# Patient Record
Sex: Female | Born: 1990 | Race: White | Hispanic: No | Marital: Single | Smoking: Current every day smoker
Health system: Southern US, Community
[De-identification: ages and names within clinical notes are randomized; demographics above are authoritative.]

## PROBLEM LIST (undated history)

## (undated) DIAGNOSIS — F32A Depression, unspecified: Secondary | ICD-10-CM

## (undated) DIAGNOSIS — A630 Anogenital (venereal) warts: Secondary | ICD-10-CM

## (undated) DIAGNOSIS — F99 Mental disorder, not otherwise specified: Secondary | ICD-10-CM

## (undated) DIAGNOSIS — F329 Major depressive disorder, single episode, unspecified: Secondary | ICD-10-CM

## (undated) DIAGNOSIS — R51 Headache: Secondary | ICD-10-CM

## (undated) HISTORY — DX: Headache: R51

## (undated) HISTORY — DX: Mental disorder, not otherwise specified: F99

---
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
## 2010-04-01 ENCOUNTER — Emergency Department (HOSPITAL_COMMUNITY)
Admission: EM | Admit: 2010-04-01 | Discharge: 2010-04-01 | Disposition: A | Discharge status: Home / Self Care | Payer: Medicaid Other | Attending: Emergency Medicine | Admitting: Emergency Medicine

## 2010-04-01 DIAGNOSIS — O99891 Other specified diseases and conditions complicating pregnancy: Secondary | ICD-10-CM | POA: Insufficient documentation

## 2010-04-01 DIAGNOSIS — H11419 Vascular abnormalities of conjunctiva, unspecified eye: Secondary | ICD-10-CM | POA: Insufficient documentation

## 2010-04-01 DIAGNOSIS — F3130 Bipolar disorder, current episode depressed, mild or moderate severity, unspecified: Secondary | ICD-10-CM | POA: Insufficient documentation

## 2010-04-01 DIAGNOSIS — B354 Tinea corporis: Secondary | ICD-10-CM | POA: Insufficient documentation

## 2010-04-01 DIAGNOSIS — H109 Unspecified conjunctivitis: Secondary | ICD-10-CM | POA: Insufficient documentation

## 2010-04-01 DIAGNOSIS — H5789 Other specified disorders of eye and adnexa: Secondary | ICD-10-CM | POA: Insufficient documentation

## 2010-04-01 LAB — URINALYSIS, ROUTINE W REFLEX MICROSCOPIC
Glucose, UA: NEGATIVE mg/dL
Hgb urine dipstick: NEGATIVE
Ketones, ur: NEGATIVE mg/dL
Protein, ur: NEGATIVE mg/dL

## 2010-04-01 LAB — URINE MICROSCOPIC-ADD ON

## 2010-04-04 LAB — CBC
HCT: 40 % (ref 36.0–46.0)
Hemoglobin: 13.5 g/dL (ref 12.0–15.0)
Hemoglobin: 14.3 g/dL (ref 12.0–15.0)
MCH: 30 pg (ref 26.0–34.0)
MCHC: 33.8 g/dL (ref 30.0–36.0)
MCV: 88.8 fL (ref 78.0–100.0)
Platelets: 253 10*3/uL (ref 150–400)
RBC: 4.5 MIL/uL (ref 3.87–5.11)
RDW: 13.2 % (ref 11.5–15.5)

## 2010-04-04 LAB — BASIC METABOLIC PANEL
BUN: 8 mg/dL (ref 6–23)
Calcium: 9.3 mg/dL (ref 8.4–10.5)
Chloride: 107 mEq/L (ref 96–112)
Creatinine, Ser: 0.75 mg/dL (ref 0.4–1.2)
Creatinine, Ser: 0.78 mg/dL (ref 0.4–1.2)
GFR calc Af Amer: >60 mL/min (ref >60)
GFR calc Af Amer: >60 mL/min (ref >60)
Glucose, Bld: 92 mg/dL (ref 70–99)
Potassium: 3.6 mEq/L (ref 3.5–5.1)
Potassium: 4 mEq/L (ref 3.5–5.1)

## 2010-04-04 LAB — URINALYSIS, ROUTINE W REFLEX MICROSCOPIC
Glucose, UA: NEGATIVE mg/dL
Ketones, ur: NEGATIVE mg/dL

## 2010-04-04 LAB — HEPATIC FUNCTION PANEL
ALT: 16 U/L (ref 0–35)
AST: 18 U/L (ref 0–37)
Albumin: 3.5 g/dL (ref 3.5–5.2)
Alkaline Phosphatase: 89 U/L (ref 39–117)
Bilirubin, Direct: 0.1 mg/dL (ref 0.0–0.3)
Indirect Bilirubin: 0.5 mg/dL (ref 0.3–0.9)
Total Bilirubin: 0.6 mg/dL (ref 0.3–1.2)
Total Protein: 6.7 g/dL (ref 6.0–8.3)

## 2010-04-04 LAB — DIFFERENTIAL
Basophils Relative: 1 % (ref 0–1)
Eosinophils Absolute: 0.1 10*3/uL (ref 0.0–0.7)
Eosinophils Relative: 1 % (ref 0–5)
Lymphs Abs: 1.5 10*3/uL (ref 0.7–4.0)
Monocytes Relative: 6 % (ref 3–12)
Neutro Abs: 6.7 10*3/uL (ref 1.7–7.7)
Neutrophils Relative %: 75 % (ref 43–77)

## 2010-04-04 LAB — RAPID URINE DRUG SCREEN, HOSP PERFORMED
Cocaine: POSITIVE — AB
Cocaine: POSITIVE — AB
Opiates: POSITIVE — AB
Tetrahydrocannabinol: NOT DETECTED

## 2010-04-04 LAB — URINE MICROSCOPIC-ADD ON

## 2010-04-04 LAB — TSH: TSH: 0.64 u[IU]/mL (ref 0.350–4.500)

## 2010-04-25 LAB — URINALYSIS, ROUTINE W REFLEX MICROSCOPIC
Bilirubin Urine: NEGATIVE
Bilirubin Urine: NEGATIVE
Glucose, UA: NEGATIVE mg/dL
Glucose, UA: NEGATIVE mg/dL
Hgb urine dipstick: NEGATIVE
Hgb urine dipstick: NEGATIVE
Ketones, ur: NEGATIVE mg/dL
Ketones, ur: NEGATIVE mg/dL
Nitrite: NEGATIVE
Nitrite: NEGATIVE
Protein, ur: NEGATIVE mg/dL
Protein, ur: NEGATIVE mg/dL
Specific Gravity, Urine: 1.025 (ref 1.005–1.030)
Specific Gravity, Urine: 1.025 (ref 1.005–1.030)
Urobilinogen, UA: 0.2 mg/dL (ref 0.0–1.0)
Urobilinogen, UA: 0.2 mg/dL (ref 0.0–1.0)
pH: 6 (ref 5.0–8.0)
pH: 5.5 (ref 5.0–8.0)

## 2010-04-25 LAB — CBC
HCT: 40 % (ref 36.0–46.0)
Hemoglobin: 13.4 g/dL (ref 12.0–15.0)
Hemoglobin: 13.8 g/dL (ref 12.0–15.0)
MCHC: 33.6 g/dL (ref 30.0–36.0)
MCHC: 33.8 g/dL (ref 30.0–36.0)
MCV: 88.3 fL (ref 78.0–100.0)
MCV: 88.8 fL (ref 78.0–100.0)
RBC: 4.53 MIL/uL (ref 3.87–5.11)
RBC: 4.61 MIL/uL (ref 3.87–5.11)
RDW: 12.5 % (ref 11.5–15.5)

## 2010-04-25 LAB — GC/CHLAMYDIA PROBE AMP, GENITAL
Chlamydia, DNA Probe: POSITIVE — AB
GC Probe Amp, Genital: NEGATIVE

## 2010-04-25 LAB — COMPREHENSIVE METABOLIC PANEL
CO2: 29 mEq/L (ref 19–32)
Calcium: 9.3 mg/dL (ref 8.4–10.5)
Chloride: 103 mEq/L (ref 96–112)
Creatinine, Ser: 0.9 mg/dL (ref 0.4–1.2)
GFR calc non Af Amer: >60 mL/min (ref >60)
Glucose, Bld: 92 mg/dL (ref 70–99)
Total Bilirubin: 0.5 mg/dL (ref 0.3–1.2)

## 2010-04-25 LAB — POCT PREGNANCY, URINE: Preg Test, Ur: NEGATIVE

## 2010-04-25 LAB — WET PREP, GENITAL
Clue Cells Wet Prep HPF POC: NONE SEEN
Trich, Wet Prep: NONE SEEN
Yeast Wet Prep HPF POC: NONE SEEN

## 2010-04-25 LAB — RAPID URINE DRUG SCREEN, HOSP PERFORMED
Amphetamines: NOT DETECTED
Barbiturates: NOT DETECTED
Benzodiazepines: NOT DETECTED

## 2010-04-25 LAB — URINE DRUGS OF ABUSE SCREEN W ALC, ROUTINE (REF LAB)
Methadone: NEGATIVE
Opiate Screen, Urine: NEGATIVE

## 2010-04-25 LAB — RPR: RPR Ser Ql: NONREACTIVE

## 2010-04-27 LAB — URINALYSIS, MICROSCOPIC ONLY
Nitrite: POSITIVE — AB
Protein, ur: NEGATIVE mg/dL
pH: 6 (ref 5.0–8.0)

## 2010-04-27 LAB — HEPATIC FUNCTION PANEL
Albumin: 4 g/dL (ref 3.5–5.2)
Alkaline Phosphatase: 100 U/L (ref 47–119)
Total Bilirubin: 0.5 mg/dL (ref 0.3–1.2)
Total Protein: 7.2 g/dL (ref 6.0–8.3)

## 2010-04-27 LAB — RAPID URINE DRUG SCREEN, HOSP PERFORMED
Amphetamines: NOT DETECTED
Barbiturates: NOT DETECTED
Benzodiazepines: POSITIVE — AB
Tetrahydrocannabinol: NOT DETECTED

## 2010-04-27 LAB — BASIC METABOLIC PANEL
BUN: 6 mg/dL (ref 6–23)
Calcium: 9 mg/dL (ref 8.4–10.5)
Creatinine, Ser: 0.68 mg/dL (ref 0.4–1.2)

## 2010-04-27 LAB — HCG, SERUM, QUALITATIVE: Preg, Serum: NEGATIVE

## 2010-04-27 LAB — ETHANOL: Alcohol, Ethyl (B): 112 mg/dL — ABNORMAL HIGH (ref 0–10)

## 2010-04-27 LAB — CBC
MCV: 87.9 fL (ref 78.0–98.0)
Platelets: 297 10*3/uL (ref 150–400)
RBC: 4.33 MIL/uL (ref 3.80–5.70)
WBC: 10.2 10*3/uL (ref 4.5–13.5)

## 2010-04-27 LAB — URINE CULTURE: Special Requests: POSITIVE

## 2010-04-27 LAB — DIFFERENTIAL
Eosinophils Absolute: 0 10*3/uL (ref 0.0–1.2)
Lymphs Abs: 2.4 10*3/uL (ref 1.1–4.8)
Monocytes Relative: 5 % (ref 3–11)
Neutro Abs: 7.3 10*3/uL (ref 1.7–8.0)
Neutrophils Relative %: 71 % (ref 43–71)

## 2010-04-27 LAB — GAMMA GT: GGT: 23 U/L (ref 7–51)

## 2010-04-27 LAB — SALICYLATE LEVEL: Salicylate Lvl: <4 mg/dL (ref 2.8–20.0)

## 2010-04-27 LAB — ACETAMINOPHEN LEVEL: Acetaminophen (Tylenol), Serum: <10 ug/mL — ABNORMAL LOW (ref 10–30)

## 2010-04-29 LAB — URINALYSIS, ROUTINE W REFLEX MICROSCOPIC
Bilirubin Urine: NEGATIVE
Glucose, UA: NEGATIVE mg/dL
Hgb urine dipstick: NEGATIVE
Ketones, ur: NEGATIVE mg/dL
Nitrite: NEGATIVE
Protein, ur: NEGATIVE mg/dL
Specific Gravity, Urine: 1.017 (ref 1.005–1.030)
Urobilinogen, UA: 0.2 mg/dL (ref 0.0–1.0)
pH: 8 (ref 5.0–8.0)

## 2010-04-29 LAB — WET PREP, GENITAL
Clue Cells Wet Prep HPF POC: NONE SEEN
Trich, Wet Prep: NONE SEEN

## 2010-04-29 LAB — GC/CHLAMYDIA PROBE AMP, GENITAL
Chlamydia, DNA Probe: NEGATIVE
GC Probe Amp, Genital: NEGATIVE

## 2010-04-29 LAB — RPR: RPR Ser Ql: NONREACTIVE

## 2010-05-05 ENCOUNTER — Emergency Department (HOSPITAL_BASED_OUTPATIENT_CLINIC_OR_DEPARTMENT_OTHER)
Admission: EM | Admit: 2010-05-05 | Discharge: 2010-05-06 | Disposition: A | Discharge status: Home / Self Care | Payer: Medicaid Other | Attending: Emergency Medicine | Admitting: Emergency Medicine

## 2010-05-05 DIAGNOSIS — O2690 Pregnancy related conditions, unspecified, unspecified trimester: Secondary | ICD-10-CM | POA: Insufficient documentation

## 2010-05-05 DIAGNOSIS — K0510 Chronic gingivitis, plaque induced: Secondary | ICD-10-CM | POA: Insufficient documentation

## 2010-05-05 DIAGNOSIS — F319 Bipolar disorder, unspecified: Secondary | ICD-10-CM | POA: Insufficient documentation

## 2010-05-05 DIAGNOSIS — H109 Unspecified conjunctivitis: Secondary | ICD-10-CM | POA: Insufficient documentation

## 2010-06-01 ENCOUNTER — Emergency Department (HOSPITAL_COMMUNITY): Payer: Medicaid Other

## 2010-06-01 ENCOUNTER — Emergency Department (HOSPITAL_COMMUNITY)
Admission: EM | Admit: 2010-06-01 | Discharge: 2010-06-01 | Disposition: A | Discharge status: Home / Self Care | Payer: Medicaid Other | Attending: Emergency Medicine | Admitting: Emergency Medicine

## 2010-06-01 DIAGNOSIS — O9989 Other specified diseases and conditions complicating pregnancy, childbirth and the puerperium: Secondary | ICD-10-CM | POA: Insufficient documentation

## 2010-06-01 DIAGNOSIS — R109 Unspecified abdominal pain: Secondary | ICD-10-CM | POA: Insufficient documentation

## 2010-06-01 DIAGNOSIS — F3130 Bipolar disorder, current episode depressed, mild or moderate severity, unspecified: Secondary | ICD-10-CM | POA: Insufficient documentation

## 2010-06-01 DIAGNOSIS — J3489 Other specified disorders of nose and nasal sinuses: Secondary | ICD-10-CM | POA: Insufficient documentation

## 2010-06-01 LAB — URINALYSIS, ROUTINE W REFLEX MICROSCOPIC
Bilirubin Urine: NEGATIVE
Ketones, ur: NEGATIVE mg/dL
Nitrite: NEGATIVE
Protein, ur: NEGATIVE mg/dL

## 2010-06-01 LAB — POCT PREGNANCY, URINE: Preg Test, Ur: POSITIVE

## 2010-06-04 LAB — GC/CHLAMYDIA PROBE AMP, GENITAL
Chlamydia, DNA Probe: NEGATIVE
GC Probe Amp, Genital: NEGATIVE
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
## 2010-06-25 ENCOUNTER — Inpatient Hospital Stay (HOSPITAL_COMMUNITY)
Admission: EM | Admit: 2010-06-25 | Discharge: 2010-06-25 | Disposition: A | Discharge status: Home / Self Care | Payer: Medicaid Other | Source: Ambulatory Visit | Attending: Obstetrics & Gynecology | Admitting: Obstetrics & Gynecology

## 2010-06-25 DIAGNOSIS — O2390 Unspecified genitourinary tract infection in pregnancy, unspecified trimester: Secondary | ICD-10-CM | POA: Insufficient documentation

## 2010-06-25 DIAGNOSIS — R300 Dysuria: Secondary | ICD-10-CM | POA: Insufficient documentation

## 2010-06-25 DIAGNOSIS — B977 Papillomavirus as the cause of diseases classified elsewhere: Secondary | ICD-10-CM | POA: Insufficient documentation

## 2010-06-25 DIAGNOSIS — O98519 Other viral diseases complicating pregnancy, unspecified trimester: Secondary | ICD-10-CM | POA: Insufficient documentation

## 2010-06-25 DIAGNOSIS — N390 Urinary tract infection, site not specified: Secondary | ICD-10-CM

## 2010-06-25 LAB — URINALYSIS, ROUTINE W REFLEX MICROSCOPIC
Ketones, ur: NEGATIVE mg/dL
Nitrite: NEGATIVE
Specific Gravity, Urine: >1.03 — ABNORMAL HIGH (ref 1.005–1.030)
Urobilinogen, UA: 0.2 mg/dL (ref 0.0–1.0)
pH: 6 (ref 5.0–8.0)

## 2010-06-25 LAB — WET PREP, GENITAL
Clue Cells Wet Prep HPF POC: NONE SEEN
Trich, Wet Prep: NONE SEEN

## 2010-06-26 ENCOUNTER — Inpatient Hospital Stay (HOSPITAL_COMMUNITY)
Admission: AD | Admit: 2010-06-26 | Discharge: 2010-06-26 | Disposition: A | Discharge status: Home / Self Care | Payer: Medicaid Other | Source: Ambulatory Visit | Attending: Obstetrics & Gynecology | Admitting: Obstetrics & Gynecology

## 2010-06-26 DIAGNOSIS — N949 Unspecified condition associated with female genital organs and menstrual cycle: Secondary | ICD-10-CM

## 2010-06-26 LAB — URINE CULTURE
Colony Count: NO GROWTH
Culture  Setup Time: 201206041938
Culture: NO GROWTH

## 2010-06-26 LAB — GC/CHLAMYDIA PROBE AMP, GENITAL: Chlamydia, DNA Probe: NEGATIVE

## 2010-07-12 ENCOUNTER — Inpatient Hospital Stay (HOSPITAL_COMMUNITY)
Admission: AD | Admit: 2010-07-12 | Discharge: 2010-07-12 | Disposition: A | Discharge status: Home / Self Care | Payer: Medicaid Other | Source: Ambulatory Visit | Attending: Obstetrics & Gynecology | Admitting: Obstetrics & Gynecology

## 2010-07-12 ENCOUNTER — Other Ambulatory Visit: Payer: Self-pay | Admitting: Obstetrics & Gynecology

## 2010-07-12 DIAGNOSIS — O99340 Other mental disorders complicating pregnancy, unspecified trimester: Secondary | ICD-10-CM | POA: Insufficient documentation

## 2010-07-12 DIAGNOSIS — O9989 Other specified diseases and conditions complicating pregnancy, childbirth and the puerperium: Secondary | ICD-10-CM

## 2010-07-12 DIAGNOSIS — O99891 Other specified diseases and conditions complicating pregnancy: Secondary | ICD-10-CM | POA: Insufficient documentation

## 2010-07-12 DIAGNOSIS — F319 Bipolar disorder, unspecified: Secondary | ICD-10-CM

## 2010-07-12 DIAGNOSIS — R197 Diarrhea, unspecified: Secondary | ICD-10-CM

## 2010-07-12 DIAGNOSIS — Z3689 Encounter for other specified antenatal screening: Secondary | ICD-10-CM

## 2010-07-12 LAB — URINALYSIS, ROUTINE W REFLEX MICROSCOPIC
Glucose, UA: NEGATIVE mg/dL
Hgb urine dipstick: NEGATIVE
Ketones, ur: NEGATIVE mg/dL
Protein, ur: NEGATIVE mg/dL
Urobilinogen, UA: 0.2 mg/dL (ref 0.0–1.0)

## 2010-07-12 LAB — CBC
HCT: 35.6 % — ABNORMAL LOW (ref 36.0–46.0)
Hemoglobin: 11.9 g/dL — ABNORMAL LOW (ref 12.0–15.0)
MCH: 30.1 pg (ref 26.0–34.0)
MCHC: 33.4 g/dL (ref 30.0–36.0)
MCV: 90.1 fL (ref 78.0–100.0)
Platelets: 223 10*3/uL (ref 150–400)
RBC: 3.95 MIL/uL (ref 3.87–5.11)
RDW: 13.9 % (ref 11.5–15.5)
WBC: 15.2 10*3/uL — ABNORMAL HIGH (ref 4.0–10.5)

## 2010-07-13 ENCOUNTER — Ambulatory Visit (HOSPITAL_COMMUNITY)
Admission: RE | Admit: 2010-07-13 | Discharge: 2010-07-13 | Disposition: A | Discharge status: Home / Self Care | Payer: Medicaid Other | Source: Ambulatory Visit | Attending: Obstetrics & Gynecology | Admitting: Obstetrics & Gynecology

## 2010-07-13 DIAGNOSIS — Z363 Encounter for antenatal screening for malformations: Secondary | ICD-10-CM | POA: Insufficient documentation

## 2010-07-13 DIAGNOSIS — O358XX0 Maternal care for other (suspected) fetal abnormality and damage, not applicable or unspecified: Secondary | ICD-10-CM | POA: Insufficient documentation

## 2010-07-13 DIAGNOSIS — Z1389 Encounter for screening for other disorder: Secondary | ICD-10-CM | POA: Insufficient documentation

## 2010-07-13 DIAGNOSIS — Z3689 Encounter for other specified antenatal screening: Secondary | ICD-10-CM

## 2010-07-16 ENCOUNTER — Inpatient Hospital Stay (HOSPITAL_COMMUNITY)
Admission: AD | Admit: 2010-07-16 | Discharge: 2010-07-16 | Disposition: A | Discharge status: Home / Self Care | Payer: Medicaid Other | Source: Ambulatory Visit | Attending: Obstetrics & Gynecology | Admitting: Obstetrics & Gynecology

## 2010-07-16 DIAGNOSIS — O9989 Other specified diseases and conditions complicating pregnancy, childbirth and the puerperium: Secondary | ICD-10-CM

## 2010-07-16 DIAGNOSIS — O99891 Other specified diseases and conditions complicating pregnancy: Secondary | ICD-10-CM | POA: Insufficient documentation

## 2010-07-16 DIAGNOSIS — R197 Diarrhea, unspecified: Secondary | ICD-10-CM

## 2010-07-16 LAB — URINALYSIS, ROUTINE W REFLEX MICROSCOPIC
Bilirubin Urine: NEGATIVE
Glucose, UA: NEGATIVE mg/dL
Hgb urine dipstick: NEGATIVE
Specific Gravity, Urine: 1.015 (ref 1.005–1.030)
Urobilinogen, UA: 0.2 mg/dL (ref 0.0–1.0)
pH: 6 (ref 5.0–8.0)

## 2010-08-08 ENCOUNTER — Encounter: Payer: Self-pay | Admitting: Family Medicine

## 2010-08-16 ENCOUNTER — Inpatient Hospital Stay (HOSPITAL_COMMUNITY)
Admission: AD | Admit: 2010-08-16 | Discharge: 2010-08-16 | Disposition: A | Discharge status: Home / Self Care | Payer: Medicaid Other | Source: Ambulatory Visit | Attending: Obstetrics and Gynecology | Admitting: Obstetrics and Gynecology

## 2010-08-16 ENCOUNTER — Encounter (HOSPITAL_COMMUNITY): Payer: Self-pay

## 2010-08-16 DIAGNOSIS — Z789 Other specified health status: Secondary | ICD-10-CM

## 2010-08-16 DIAGNOSIS — Z9189 Other specified personal risk factors, not elsewhere classified: Secondary | ICD-10-CM

## 2010-08-16 DIAGNOSIS — Z87891 Personal history of nicotine dependence: Secondary | ICD-10-CM | POA: Insufficient documentation

## 2010-08-16 HISTORY — DX: Major depressive disorder, single episode, unspecified: F32.9

## 2010-08-16 HISTORY — DX: Depression, unspecified: F32.A

## 2010-08-16 NOTE — ED Provider Notes (Signed)
History   Pt presents for eval of possible infected area where belly ring was, and also 'soreness' over abd. Denies leak, bldg, ctx or fever. No dysuria. Reports she squeezed piercing site and noticed pus coming out.  Chief Complaint  Patient presents with  . Wound Infection   HPI  OB History    Grav Para Term Preterm Abortions TAB SAB Ect Mult Living   1 0        0      Past Medical History  Diagnosis Date  . Mental disorder     Bipolar, depression  . Headache     migraines  . Depression     Pt states, "That was in the past.  I stopped taking those meds a long time ago."    History reviewed. No pertinent past surgical history.  No family history on file.  History  Substance Use Topics  . 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 Smokeless tobacco: Not on file  . Alcohol Use: No     Pt stopped drinking with (+) UPT    Allergies: No Known Allergies  Prescriptions prior to admission  Medication Sig Dispense Refill  . Prenatal Vit-Fe Psac Cmplx-FA (PRENATAL MULTIVITAMIN) 60-1 MG tablet Take 1 tablet by mouth daily with breakfast.         ROS Physical Exam   Blood pressure 129/79, pulse 107, temperature 98.9 F (37.2 C), temperature source Oral, resp. rate 16, height 5' 5.5" (1.664 m), weight 88.27 kg (194 lb 9.6 oz), last menstrual period 02/22/2010, SpO2 99.00%.  Physical Exam Abd: gravid, soft, NT; naval: slightly pink in color, no discharge noted, no cellulitis or area of concern; appears to be healing  MAU Course  Procedures  MDM   Assessment and Plan  IUP at 23.6 Healing naval piercing site Tender abd  Rev'd stretching of abd tissue/muscles which can create a sore sensation- tips given; reassured.  Enc to put antibacterial ointment on piercing site TID until it is completely healed.  SHAW,KIMBERLY B 08/16/2010, 8:18 PM

## 2010-08-16 NOTE — Progress Notes (Signed)
"  I took my belly ring out about 4 weeks ago, but today I started having pain around my belly button.  I squeezed it and pus cam out the bottom of it.  I called the doctor's office for them to call me in a Rx for antibiotic, but they wanted me to come here to be on the safe side."

## 2010-08-16 NOTE — Progress Notes (Signed)
Pt states she removed her belly button piercing about 4 weeks ago. Has noticed it becoming red and when she squeezed it today pus came out. Has a aching feeling all over abdomen. Reports good movement, no leaking or bleeding.

## 2010-08-31 ENCOUNTER — Encounter (HOSPITAL_COMMUNITY): Payer: Self-pay | Admitting: *Deleted

## 2010-08-31 ENCOUNTER — Inpatient Hospital Stay (HOSPITAL_COMMUNITY)
Admission: AD | Admit: 2010-08-31 | Discharge: 2010-08-31 | Disposition: A | Discharge status: Home / Self Care | Payer: Medicaid Other | Source: Ambulatory Visit | Attending: Obstetrics and Gynecology | Admitting: Obstetrics and Gynecology

## 2010-08-31 DIAGNOSIS — N949 Unspecified condition associated with female genital organs and menstrual cycle: Secondary | ICD-10-CM | POA: Insufficient documentation

## 2010-08-31 DIAGNOSIS — N938 Other specified abnormal uterine and vaginal bleeding: Secondary | ICD-10-CM | POA: Insufficient documentation

## 2010-08-31 DIAGNOSIS — A630 Anogenital (venereal) warts: Secondary | ICD-10-CM

## 2010-08-31 HISTORY — DX: Anogenital (venereal) warts: A63.0

## 2010-08-31 LAB — WET PREP, GENITAL
Clue Cells Wet Prep HPF POC: NONE SEEN
Trich, Wet Prep: NONE SEEN
Yeast Wet Prep HPF POC: NONE SEEN

## 2010-08-31 NOTE — Progress Notes (Signed)
Pt states "vaginal spotting since yesterday"

## 2010-08-31 NOTE — ED Provider Notes (Addendum)
History   Pt presents today c/o vag spotting that began yesterday. 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 She denies abd pain or heavy vag bleeding. She denies fever or any other sx at this time.  Chief Complaint  Patient presents with  . Vaginal Discharge   HPI  OB History    Grav Para Term Preterm Abortions TAB SAB Ect Mult Living   1 0        0      Past Medical History  Diagnosis Date  . Mental disorder     Bipolar, depression  . Headache     migraines  . Depression     Pt states, "That was in the past.  I stopped taking those meds a long time ago."  . Genital warts     No past surgical history on file.  No family history on file.  History  Substance Use Topics  . 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 Smokeless tobacco: Not on file  . Alcohol Use: No     Pt stopped drinking with (+) UPT    Allergies: No Known Allergies  Prescriptions prior to admission  Medication Sig Dispense Refill  . calcium carbonate (TUMS - DOSED IN MG ELEMENTAL CALCIUM) 500 MG chewable tablet Chew 2 tablets by mouth 2 (two) times daily as needed. For heart burn       . Chlorpheniramine-DM (ROBITUSSIN CHILD COUGH/COLD LA) 1-7.5 MG/5ML LIQD Take 10 mLs by mouth as needed. For cough        . diphenhydrAMINE (BENADRYL ALLERGY) 12.5 MG/5ML liquid Take 12.5 mg by mouth at bedtime as needed. For sleep and cold.       . metroNIDAZOLE (FLAGYL) 500 MG tablet Take 500 mg by mouth 2 (two) times daily. Pt started on 08/23/10 ended 08/30/10       . prenatal vitamin w/FE, FA (PRENATAL 1 + 1) 27-1 MG TABS Take 1 tablet by mouth daily.        . Prenatal Vit-Fe Psac Cmplx-FA (PRENATAL MULTIVITAMIN) 60-1 MG tablet Take 1 tablet by mouth daily with breakfast.         Review of Systems  Constitutional: Negative for fever and chills.  Cardiovascular: Negative for chest pain.  Gastrointestinal: Negative for nausea, vomiting, abdominal pain, diarrhea and  constipation.  Genitourinary: Negative for dysuria, urgency, frequency and hematuria.  Neurological: Negative for dizziness and headaches.  Psychiatric/Behavioral: Negative for depression and suicidal ideas.   Physical Exam   Blood pressure 132/89, pulse 103, temperature 98.4 F (36.9 C), temperature source Oral, resp. rate 20, height 5' 5.5" (1.664 m), weight 197 lb 3.2 oz (89.449 kg), last menstrual period 02/22/2010, SpO2 99.00%.  Physical Exam  Constitutional: She is oriented to person, place, and time. She appears well-developed and well-nourished. No distress.  HENT:  Head: Normocephalic and atraumatic.  Eyes: EOM are normal. Pupils are equal, round, and reactive to light.  GI: Soft. She exhibits no distension. There is no tenderness. There is no rebound and no guarding.  Genitourinary:    No bleeding around the vagina. Vaginal discharge found.  Neurological: She is alert and oriented to person, place, and time.  Skin: Skin is warm and dry. She is not diaphoretic.  Psychiatric: She has a normal mood and affect. Her behavior is normal. Judgment and thought content normal.    MAU Course  Procedures  Discussed this pt with Dr. Ellyn Hack at length.  Results for orders placed during the hospital encounter of 08/31/10 (from the past 24 hour(s))  WET PREP, GENITAL     Status: Abnormal   Collection Time   08/31/10  2:10 PM      Component Value Range   Yeast, Wet Prep NONE SEEN  NONE SEEN    Trich, Wet Prep NONE SEEN  NONE SEEN    Clue Cells, Wet Prep NONE SEEN  NONE SEEN    WBC, Wet Prep HPF POC FEW (*) NONE SEEN    NST reactive for 26wks. No ctx.  Assessment and Plan  Pt is having spotting associated with recent condyloma tx. Discussed this with pt at length. She has a f/u appt scheduled for Wed. Discussed diet, activity, risks, and precautions.  Clinton Gallant. Jaysha Lasure III, DrHSc, MPAS, PA-C  08/31/2010, 2:25 PM   Henrietta Hoover, PA 08/31/10 1443

## 2010-08-31 NOTE — Progress Notes (Signed)
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 Has been having pink spotting that started 8-9 and some lower abdominal and low back pain.

## 2010-10-23 LAB — COMPREHENSIVE METABOLIC PANEL
AST: 21
Albumin: 4
BUN: 9
Chloride: 109
Creatinine, Ser: 0.85
Total Protein: 6.9

## 2010-10-23 LAB — RAPID URINE DRUG SCREEN, HOSP PERFORMED
Amphetamines: NOT DETECTED
Benzodiazepines: NOT DETECTED
Cocaine: NOT DETECTED
Tetrahydrocannabinol: NOT DETECTED

## 2010-10-23 LAB — DIFFERENTIAL
Eosinophils Relative: 0
Lymphocytes Relative: 25
Lymphs Abs: 1.9
Monocytes Absolute: 0.4
Monocytes Relative: 5
Neutro Abs: 5.2

## 2010-10-23 LAB — POCT PREGNANCY, URINE: Preg Test, Ur: NEGATIVE

## 2010-10-23 LAB — CBC
HCT: 38.3
MCV: 88.9
Platelets: 265
RDW: 12.4
WBC: 7.6

## 2010-10-26 LAB — COMPREHENSIVE METABOLIC PANEL
ALT: 17 U/L (ref 0–35)
Albumin: 3.8 g/dL (ref 3.5–5.2)
Calcium: 8.9 mg/dL (ref 8.4–10.5)
Glucose, Bld: 119 mg/dL — ABNORMAL HIGH (ref 70–99)
Potassium: 3.7 mEq/L (ref 3.5–5.1)
Sodium: 139 mEq/L (ref 135–145)
Total Protein: 6.4 g/dL (ref 6.0–8.3)

## 2010-10-26 LAB — DIFFERENTIAL
Eosinophils Absolute: 0.1 10*3/uL (ref 0.0–1.2)
Lymphs Abs: 2 10*3/uL (ref 1.1–4.8)
Monocytes Absolute: 0.5 10*3/uL (ref 0.2–1.2)
Monocytes Relative: 5 % (ref 3–11)
Neutrophils Relative %: 76 % — ABNORMAL HIGH (ref 43–71)

## 2010-10-26 LAB — CBC
Hemoglobin: 13.2 g/dL (ref 12.0–16.0)
MCHC: 33.3 g/dL (ref 31.0–37.0)
Platelets: 259 10*3/uL (ref 150–400)
RDW: 12.6 % (ref 11.4–15.5)

## 2010-10-26 LAB — RAPID URINE DRUG SCREEN, HOSP PERFORMED
Amphetamines: NOT DETECTED
Barbiturates: POSITIVE — AB
Benzodiazepines: NOT DETECTED
Opiates: NOT DETECTED
Tetrahydrocannabinol: NOT DETECTED

## 2010-10-26 LAB — ACETAMINOPHEN LEVEL: Acetaminophen (Tylenol), Serum: 29.2 ug/mL (ref 10–30)

## 2010-10-26 LAB — ETHANOL: Alcohol, Ethyl (B): <5 mg/dL (ref 0–10)

## 2011-03-07 ENCOUNTER — Encounter (HOSPITAL_COMMUNITY): Payer: Self-pay | Admitting: *Deleted

## 2012-02-25 NOTE — Progress Notes (Signed)
This encounter was created in error - please disregard.

## 2012-06-29 IMAGING — US US OB EACH ADDL GEST<[ID]
1 series · 14 of 26 positions shown · non-contrast
Comparison: None.

***ADDENDUM*** CREATED: 06/22/2010 [DATE]

Due to an administrative error, the original report had the
incorrect exam title.  Please see below for the corrected exam
title.
US OB COMP ADD'L GEST LESS 14 WKS
***END ADDENDUM*** SIGNED BY: Yoel Tiger, M.D.
CLINICAL DATA: Pregnant, bilateral lower extremity pain signs 3
days, no bleeding
RADIOLOGY EXAMINATION

[Series 1: us ob each addl gest<(id) · 0.22mm/px · 14 of 26 slices shown]
[im 1/26]
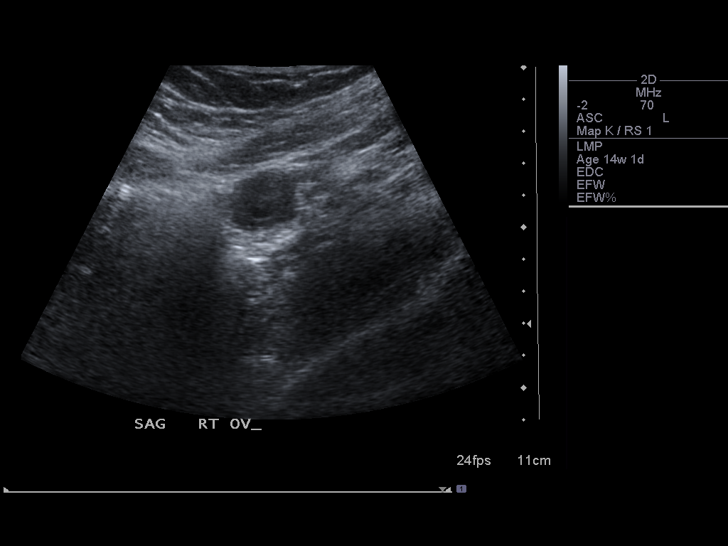
[im 3/26]
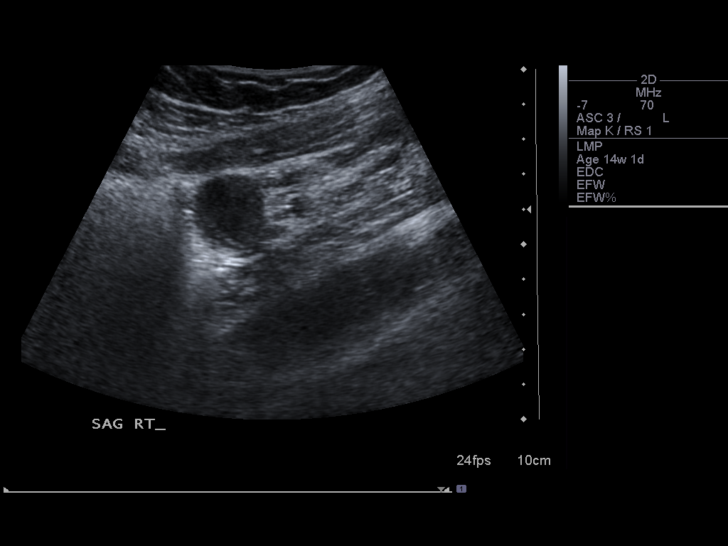
[im 5/26]
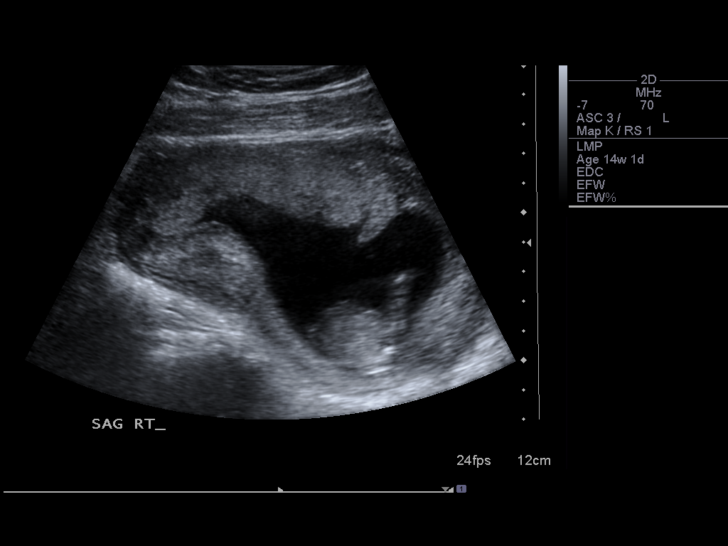
[im 7/26]
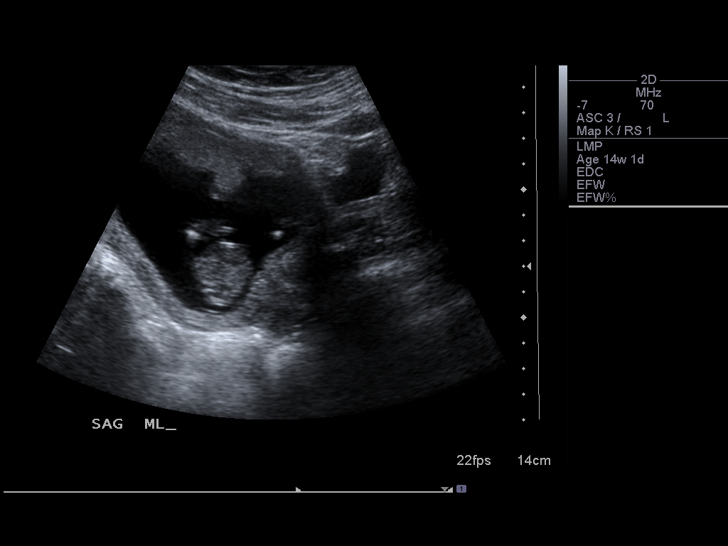
[im 9/26]
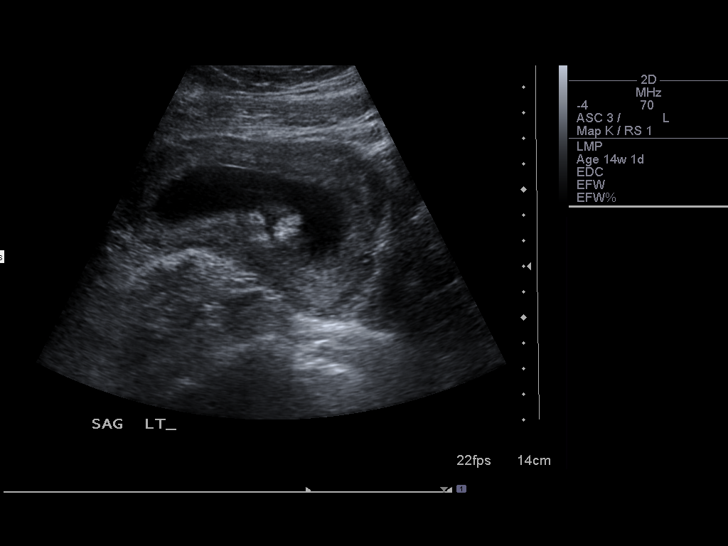
[im 11/26]
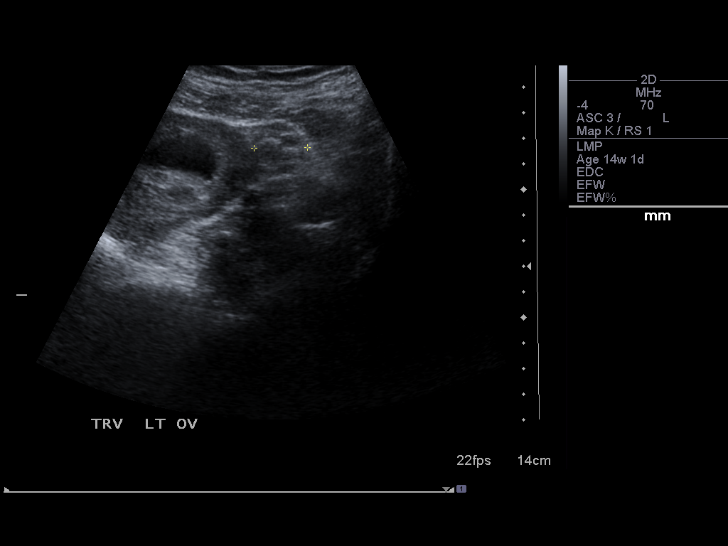
[im 13/26]
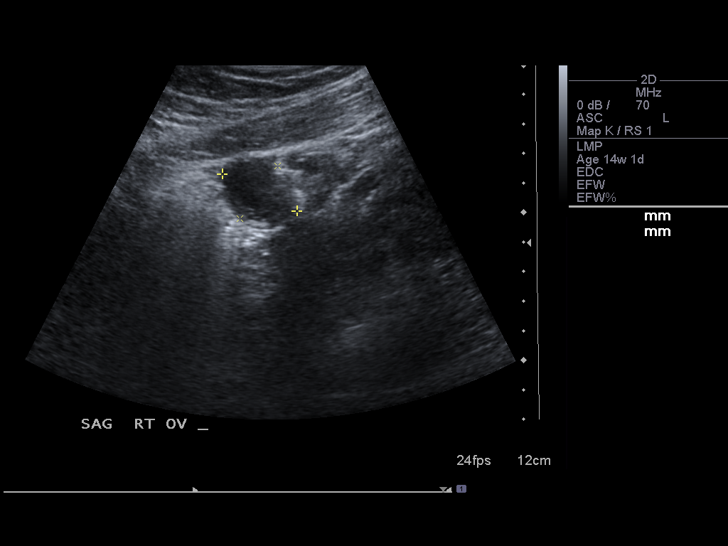
[im 14/26]
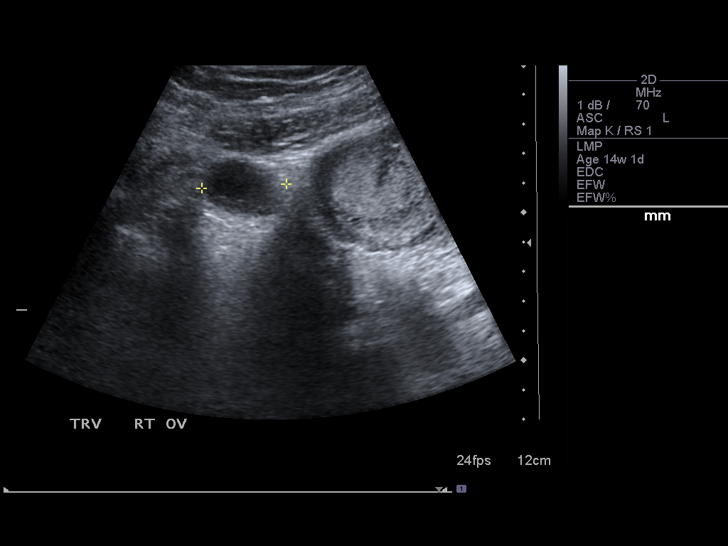
[im 16/26]
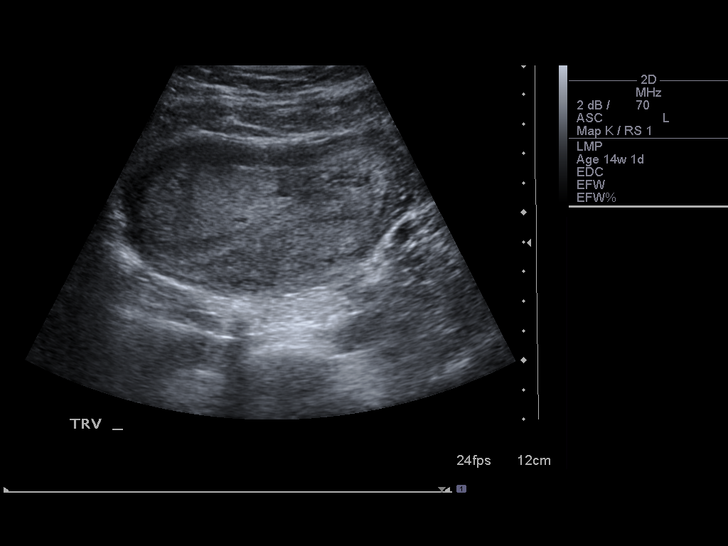
[im 18/26]
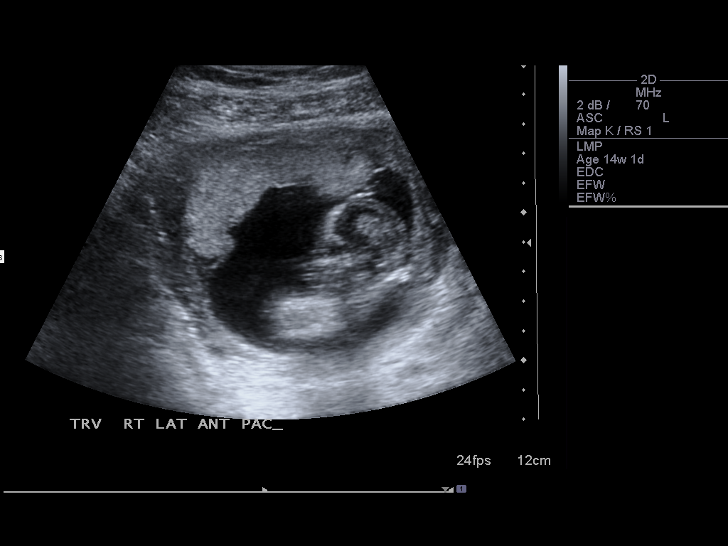
[im 20/26]
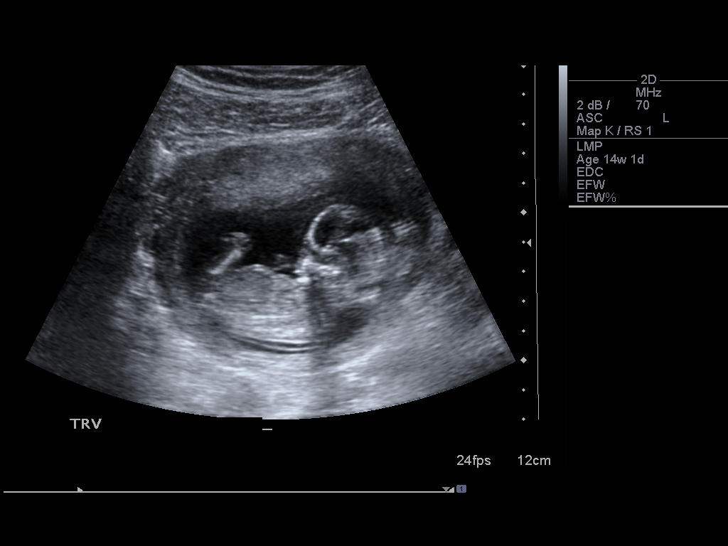
[im 22/26]
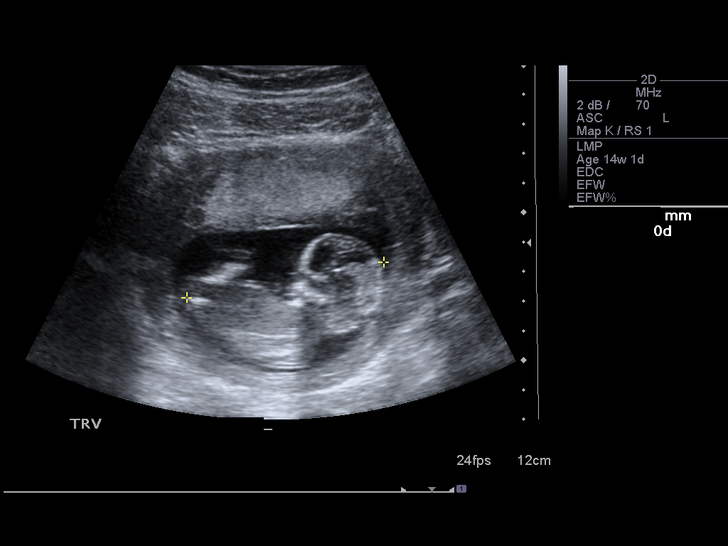
[im 24/26]
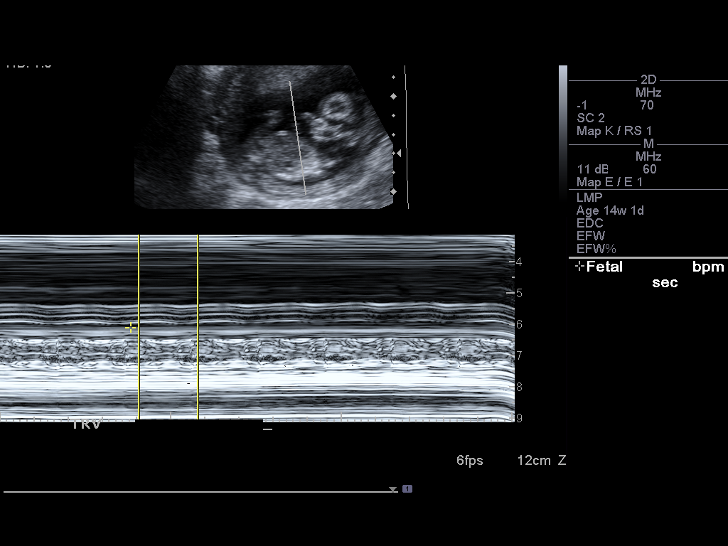
[im 26/26]
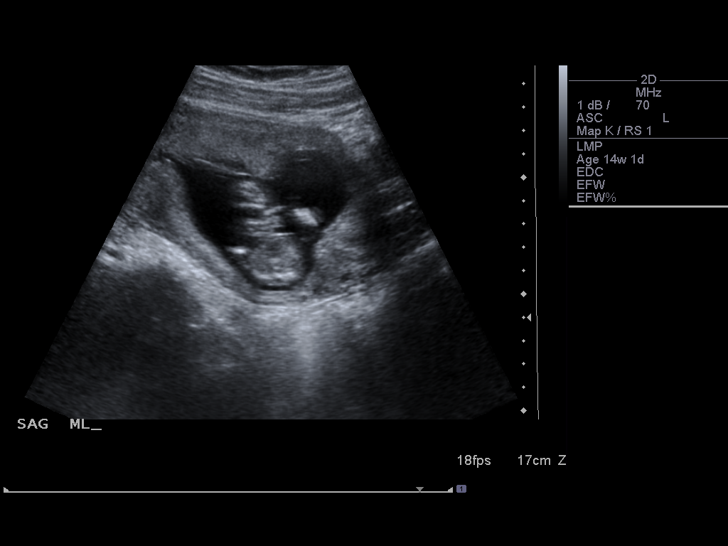

[14 of 26 positions shown; findings below may reference images not displayed]

FINDINGS: A single intrauterine gestation is visualized.

Crown-rump length measures 6.74 cm, corresponding to an estimated
gestational age of 13 weeks 0 days.  Cardiac activity is present,
measuring 173 beats per minute. A dedicated fetal anatomic survey
was not performed.

The placenta is anterior.

The visualized ovaries are unremarkable.
IMPRESSION: Single live intrauterine gestation with estimated gestational age
13 weeks 0 days by crown-rump length.  Dedicated fetal anatomic
survey was not performed.

## 2012-08-10 IMAGING — US US OB DETAIL+14 WK
1 series · 12 of 28 positions shown · non-contrast
Comparison: none

[Series 1: us ob detail +14 wk · 12 of 84 slices shown]
[im 4/84]
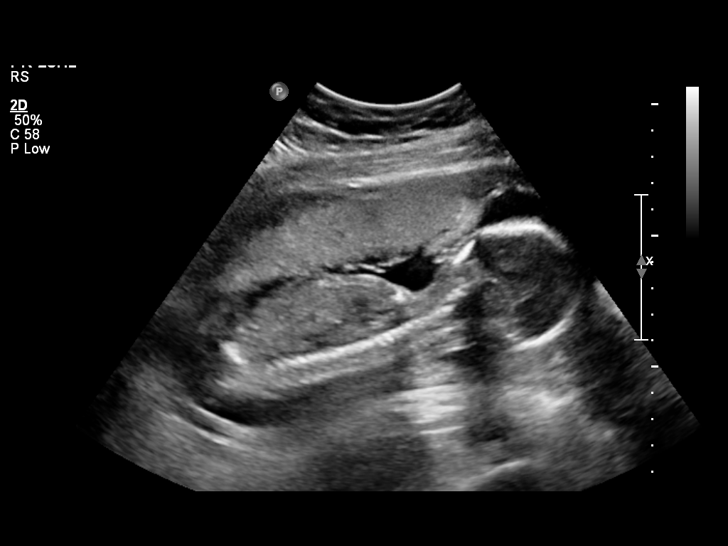
[im 10/84]
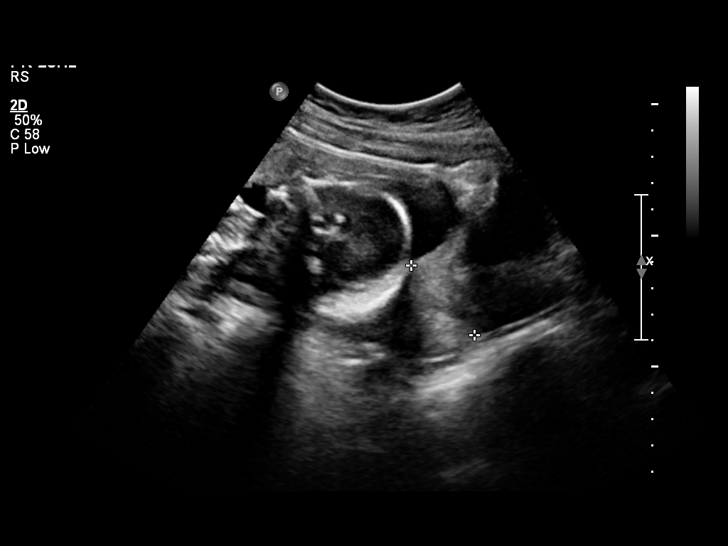
[im 16/84]
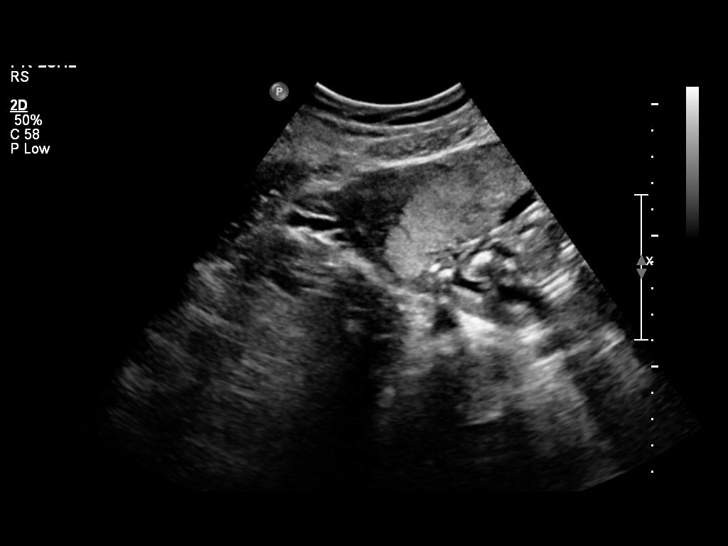
[im 25/84]
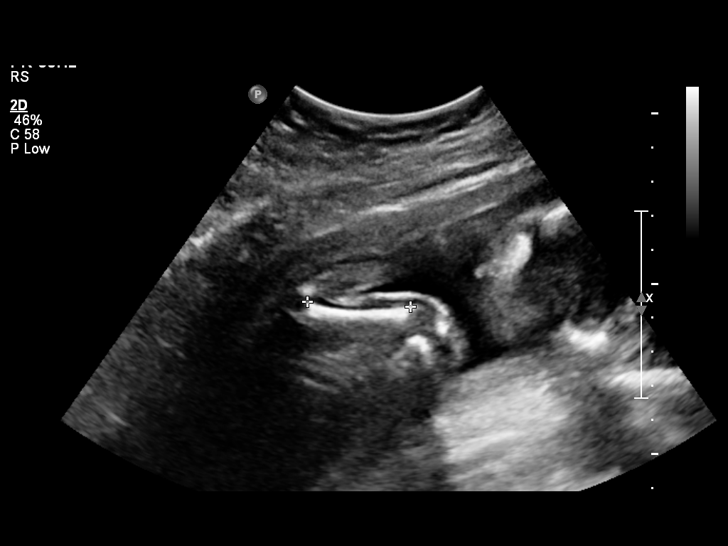
[im 31/84]
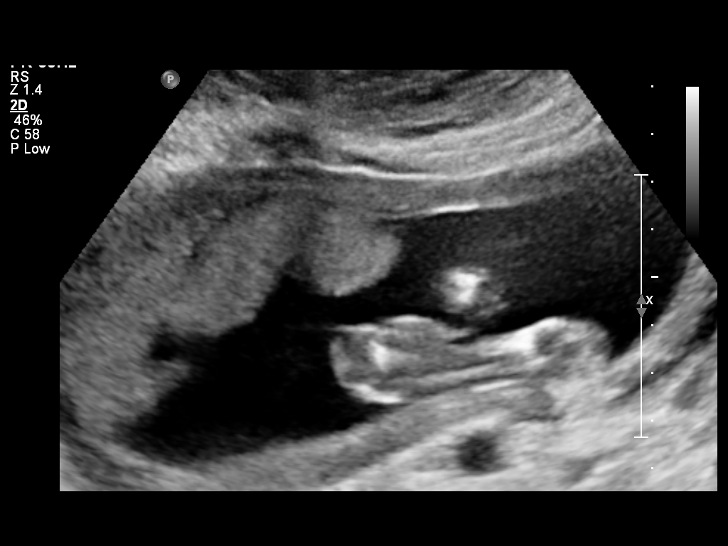
[im 37/84]
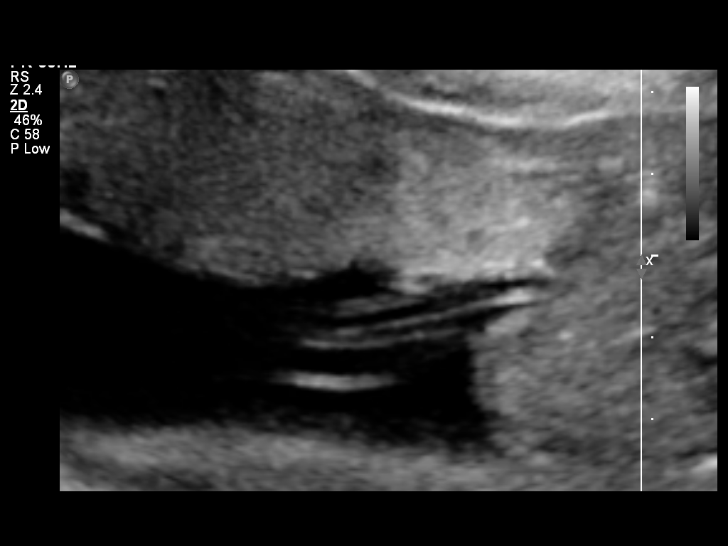
[im 47/84]
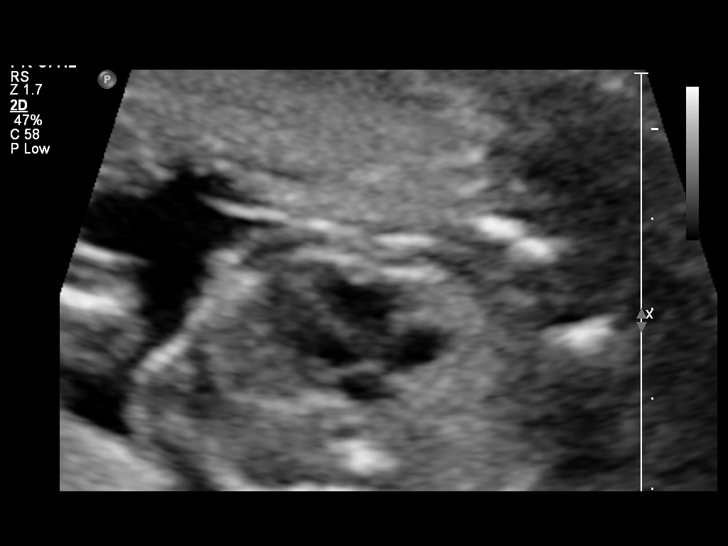
[im 53/84]
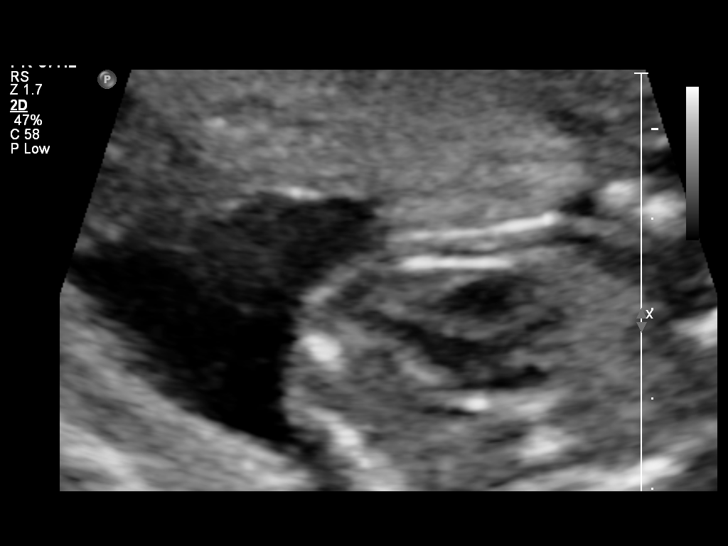
[im 59/84]
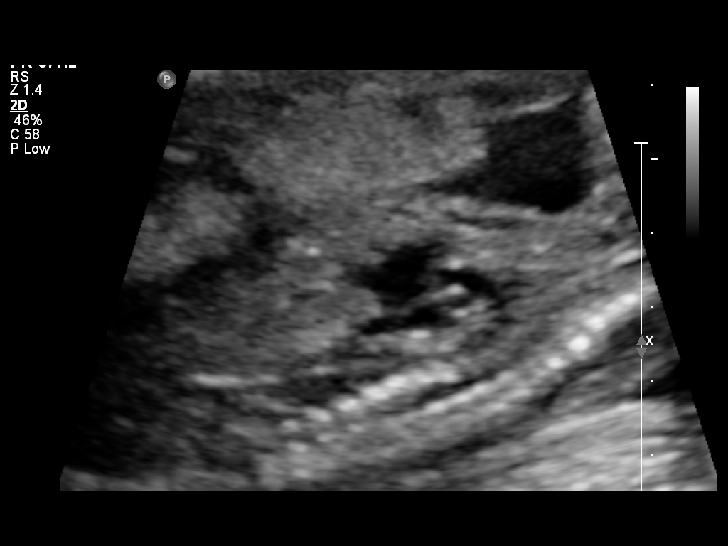
[im 68/84]
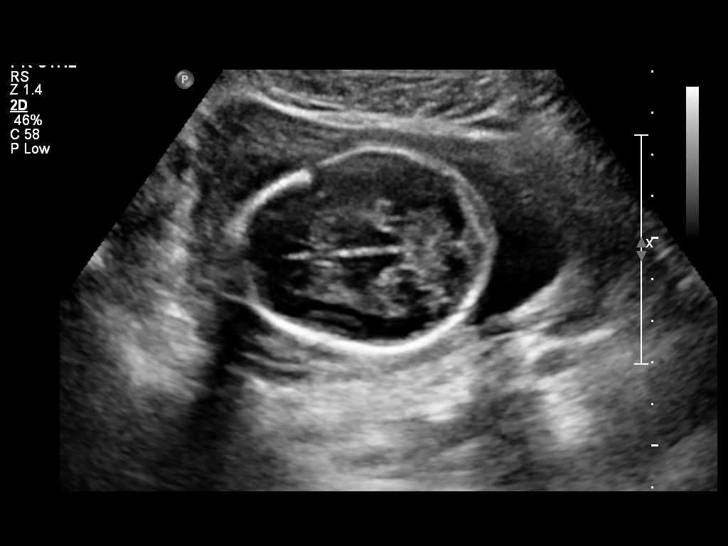
[im 74/84]
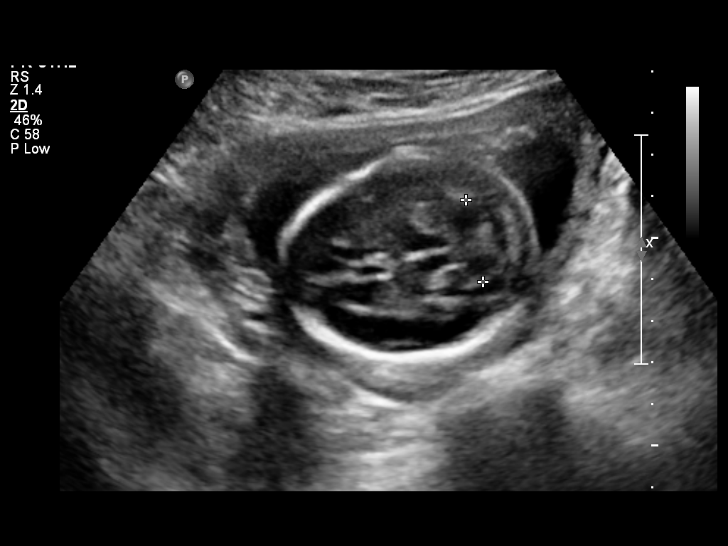
[im 80/84]
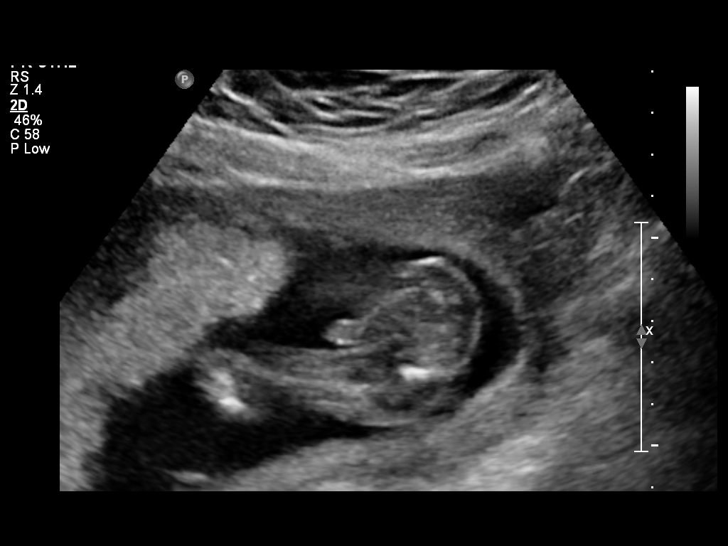

[12 of 28 positions shown; findings below may reference images not displayed]

OBSTETRICS REPORT
                      (Signed Final 07/13/2010 [DATE])

 Order#:         [id number]
Procedures

 US OB DETAIL + 14 WK                                  76811.0
Indications

 Detailed fetal anatomic survey
Fetal Evaluation

 Fetal Heart Rate:  142                          bpm
 Cardiac Activity:  Observed
 Presentation:      Cephalic
 Placenta:          Anterior, above cervical os
 P. Cord            Visualized
 Insertion:

 Amniotic Fluid
 AFI FV:      Subjectively within normal limits
                                             Larg Pckt:     4.1  cm
Biometry

 BPD:     48.4  mm     G. Age:  20w 4d                CI:        74.05   70 - 86
                                                      FL/HC:      19.0   16.1 -

 HC:     178.6  mm     G. Age:  20w 2d       92  %    HC/AC:      1.19   1.09 -

 AC:     150.1  mm     G. Age:  20w 1d       83  %    FL/BPD:
 FL:        34  mm     G. Age:  20w 5d       92  %    FL/AC:      22.7   20 - 24
 HUM:     30.4  mm     G. Age:  20w 0d       77  %
 NFT:        4  mm

 Est. FW:     354  gm    0 lb 12 oz      66  %
Gestational Age

 LMP:           20w 1d        Date:  02/22/10                 EDD:   11/29/10
 U/S Today:     20w 3d                                        EDD:   11/27/10
 Best:          19w 0d     Det. By:  Early Ultrasound         EDD:   12/07/10
Anatomy
 Cranium:           Appears normal      Aortic Arch:       Appears normal
 Fetal Cavum:       Appears normal      Ductal Arch:       Appears normal
 Ventricles:        Appears normal      Diaphragm:         Appears normal
 Choroid Plexus:    Appears normal      Stomach:           Appears normal
 Cerebellum:        Appears normal      Abdomen:           Appears normal
 Posterior Fossa:   Appears normal      Abdominal Wall:    Appears nml
                                                           (cord insert,
                                                           abd wall)
 Nuchal Fold:       Appears normal      Cord Vessels:      Appears normal
                    (neck, nuchal                          (3 vessel cord)
                    fold)
 Face:              Appears normal      Kidneys:           Appear normal
                    (lips/profile/orbit
                    s)
 Heart:             Appears normal      Bladder:           Appears normal
                    (4 chamber &
                    axis)
 RVOT:              Appears normal      Spine:             Appears normal
 LVOT:              Appears normal      Limbs:             Appears normal
                                                           (hands, ankles,
                                                           feet)

 Other:     Male gender. Heels and 5th digit visualized.
Cervix Uterus Adnexa

 Cervical Length:    3.65     cm

 Cervix:       Normal appearance by transabdominal scan.
 Left Ovary:    Not visualized.
 Right Ovary:   Not visualized.
 Adnexa:     No abnormality visualized.
Impression

 Siup demonstrating an EGA by ultrasound of 20w 3d. This is
 correlated with expected EGA by early ultrasound of 19w 0d.

 No focal fetal or placental abnormalities are noted with a
 good anatomic evaluation possible. No soft markers for Down
 Syndrome are seen. Given the expected age at delivery of
 20, today's normal ultrasound would decrease the age related
 risk for Down Syndrome from [DATE] to [DATE] (Milano et al).
 Correlation with other aneuploidy screening results, if
 available, would be recommended for a more complete risk
 assessment.

 Subjectively and quantitatively normal amniotic fluid volume.
 Normal cervical length.

 questions or concerns.

## 2013-11-22 ENCOUNTER — Encounter (HOSPITAL_COMMUNITY): Payer: Self-pay | Admitting: *Deleted

## 2016-11-19 ENCOUNTER — Encounter (HOSPITAL_BASED_OUTPATIENT_CLINIC_OR_DEPARTMENT_OTHER): Payer: Self-pay | Admitting: Emergency Medicine

## 2016-11-19 ENCOUNTER — Emergency Department (HOSPITAL_BASED_OUTPATIENT_CLINIC_OR_DEPARTMENT_OTHER)
Admission: EM | Admit: 2016-11-19 | Discharge: 2016-11-19 | Disposition: A | Discharge status: Home / Self Care | Payer: Self-pay | Attending: Emergency Medicine | Admitting: Emergency Medicine

## 2016-11-19 DIAGNOSIS — F17200 Nicotine dependence, unspecified, uncomplicated: Secondary | ICD-10-CM | POA: Insufficient documentation

## 2016-11-19 DIAGNOSIS — Z79899 Other long term (current) drug therapy: Secondary | ICD-10-CM | POA: Insufficient documentation

## 2016-11-19 DIAGNOSIS — F319 Bipolar disorder, unspecified: Secondary | ICD-10-CM | POA: Insufficient documentation

## 2016-11-19 DIAGNOSIS — R1031 Right lower quadrant pain: Secondary | ICD-10-CM | POA: Insufficient documentation

## 2016-11-19 LAB — PREGNANCY, URINE: Preg Test, Ur: NEGATIVE

## 2016-11-19 LAB — URINALYSIS, ROUTINE W REFLEX MICROSCOPIC
BILIRUBIN URINE: NEGATIVE
GLUCOSE, UA: NEGATIVE mg/dL
HGB URINE DIPSTICK: NEGATIVE
Ketones, ur: NEGATIVE mg/dL
Leukocytes, UA: NEGATIVE
Nitrite: NEGATIVE
PH: 6.5 (ref 5.0–8.0)
Protein, ur: NEGATIVE mg/dL

## 2016-11-19 LAB — HCG, QUANTITATIVE, PREGNANCY: hCG, Beta Chain, Quant, S: <1 m[IU]/mL (ref <5)

## 2016-11-19 NOTE — ED Triage Notes (Signed)
Pt c/o RLQ pain that started today; + preg test at home; has expired implant in LUE; has been unable to replace it d/t financial constraints

## 2016-11-19 NOTE — ED Notes (Signed)
ED Provider at bedside now

## 2016-11-21 NOTE — ED Provider Notes (Signed)
[facility] EMERGENCY DEPARTMENT Provider Note   CSN: [id number] Arrival date & time: 11/19/16  1921     History   Chief Complaint Chief Complaint  Patient presents with  . Abdominal Pain    HPI Wendy Stafford is a 26 y.o. female.  HPI Patient has had some sharp right lower quadrant pain.  She took a positive home pregnancy test.  She reports her birth control had expired.  No pain burning or urgency with urination.  No abnormal vaginal discharge. Past Medical History:  Diagnosis Date  . Depression    Pt states, "That was in the past.  I stopped taking those meds a long time ago."  . Genital warts   . Headache(784.0)    migraines  . Mental disorder    Bipolar, depression    There are no active problems to display for this patient.   Past Surgical History:  Procedure Laterality Date  . CESAREAN SECTION      OB History    Gravida Para Term Preterm AB Living   2 0       0   SAB TAB Ectopic Multiple Live Births                   Home Medications    Prior to Admission medications   Medication Sig Start Date End Date Taking? Authorizing Provider  calcium carbonate (TUMS - DOSED IN MG ELEMENTAL CALCIUM) 500 MG chewable tablet Chew 2 tablets by mouth 2 (two) times daily as needed. For heart burn    [provider]  Chlorpheniramine-DM (ROBITUSSIN CHILD COUGH/COLD LA) 1-7.5 MG/5ML LIQD Take 10 mLs by mouth as needed. For cough      [provider]  diphenhydrAMINE (BENADRYL ALLERGY) 12.5 MG/5ML liquid Take 12.5 mg by mouth at bedtime as needed. For sleep and cold.     [provider]  metroNIDAZOLE (FLAGYL) 500 MG tablet Take 500 mg by mouth 2 (two) times daily. Pt started on 08/23/10 ended 08/30/10     [provider]  Prenatal Vit-Fe Psac Cmplx-FA (PRENATAL MULTIVITAMIN) 60-1 MG tablet Take 1 tablet by mouth daily with breakfast.     [provider]  prenatal vitamin w/FE, FA (PRENATAL 1 + 1) 27-1 MG TABS Take 1  tablet by mouth daily.      [provider]    Family History History reviewed. No pertinent family history.  Social History Social History  Substance Use Topics  . Smoking status: Current Every Day Smoker  . Smokeless tobacco: Never Used  . Alcohol use Yes     Comment: occ     Allergies   Patient has no known allergies.   Review of Systems Review of Systems 10 Systems reviewed and are negative for acute change except as noted in the HPI.   Physical Exam Updated Vital Signs BP 123/90 (BP Location: Right Arm)   Pulse (!) 102   Temp 98.8 F (37.1 C) (Oral)   Resp 16   Ht 5\' 7"  (1.702 m)   Wt 91.6 kg (202 lb)   LMP 09/04/2016   SpO2 99%   BMI 31.64 kg/m   Physical Exam  Constitutional: She appears well-developed and well-nourished. No distress.  HENT:  Head: Normocephalic and atraumatic.  Eyes: Conjunctivae are normal.  Neck: Neck supple.  Cardiovascular: Normal rate and regular rhythm.   No murmur heard. Pulmonary/Chest: Effort normal and breath sounds normal. No respiratory distress.  Abdominal: Soft. There is no tenderness.  Adam is nontender to palpation.  Musculoskeletal: She exhibits no edema.  Neurological: She is alert.  Skin: Skin is warm and dry.  Psychiatric: She has a normal mood and affect.  Nursing note and vitals reviewed.    ED Treatments / Results  Labs (all labs ordered are listed, but only abnormal results are displayed) Labs Reviewed  URINALYSIS, ROUTINE W REFLEX MICROSCOPIC - Abnormal; Notable for the following:       Result Value   Color, Urine STRAW (*)    Specific Gravity, Urine <1.005 (*)    All other components within normal limits  PREGNANCY, URINE  HCG, QUANTITATIVE, PREGNANCY    EKG  EKG Interpretation None       Radiology No results found.  Procedures Procedures (including critical care time)  Medications Ordered in ED Medications - No data to display   Initial Impression / Assessment and Plan  / ED Course  I have reviewed the triage vital signs and the nursing notes.  Pertinent labs & imaging results that were available during my care of the patient were reviewed by me and considered in my medical decision making (see chart for details).      Final Clinical Impressions(s) / ED Diagnoses   Final diagnoses:  Right lower quadrant abdominal pain  Pregnancy test is negative.  Patient's main concern was for pregnancy.  She does not have any active abdominal pain.  Follow-up with GYN advised.  Return precautions reviewed.  New Prescriptions Discharge Medication List as of 11/19/2016 11:26 PM       Arby Barrette, MD 11/21/16 0025
